# Patient Record
Sex: Female | Born: 1940 | Race: Black or African American | Hispanic: No | Marital: Single
Health system: Southern US, Community
[De-identification: ages and names within clinical notes are randomized; demographics above are authoritative.]

---

## 1999-11-20 ENCOUNTER — Encounter: Admission: RE | Admit: 1999-11-20 | Discharge: 1999-11-20 | Payer: Self-pay | Admitting: *Deleted

## 1999-11-20 ENCOUNTER — Encounter: Payer: Self-pay | Admitting: *Deleted

## 2018-01-23 ENCOUNTER — Other Ambulatory Visit: Payer: Self-pay | Admitting: Pharmacist

## 2018-01-23 NOTE — Patient Outreach (Signed)
[facility] Care Management  01/23/2018  Signe Coltlvina Leggette January 19, 1941 [id number]   Incoming call from [name] in response to the [facility]. Speak with patient. HIPAA identifiers verified and verbal consent received.  Ms. [name] reports that she takes her Janumet 50-1000 mg once daily as directed. Denies any missed doses or barriers to adherence. Reports that she has Extra Help with the cost of her medications. Reports that her blood sugar first thing this morning was 112 mg/dL. Counsel patient on the importance of medication adherence and discuss blood sugar control. Patient verbalizes understanding. Reports that she does not currently use a pillbox to organize her medications. Counsel on the benefit of this tool to aid with adherence. Patient expresses interest in using a weekly pillbox to improve her adherence and reports that she will go to pick one up from her pharmacy.  Ms. [name] denies any further medication questions/concerns at this time. Provide patient with my phone number. Will close pharmacy episode.  Duanne MoronElisabeth Michall Noffke, PharmD, [facility] Clinical Pharmacist [facility] Care Management [phone]

## 2018-12-04 ENCOUNTER — Other Ambulatory Visit: Payer: Self-pay

## 2018-12-04 NOTE — Patient Outreach (Signed)
[facility]) Care Management  12/04/2018  Christina Valentine 09-05-1940 [id number]   Medication Adherence call to Christina Valentine Hippa Identifiers Verify spoke with patient she is due on Janumet Xr 50/1000 mg patient explain her son picks up her medication from the pharmacy and will pick  Up soon, patient wants more information on patients assistance. kevin rph will call patient to better assist. Christina Valentine is showing past due under [facility] Ins.   Lillia Abed CPhT Pharmacy Technician [facility] Care Management Direct Dial [phone]  Fax [phone] [email or web address]

## 2019-01-01 ENCOUNTER — Other Ambulatory Visit: Payer: Self-pay | Admitting: Pharmacist

## 2019-01-01 NOTE — Patient Outreach (Signed)
[facility]) Care Management  01/01/2019  Navreet Forino 1941/06/08 [id number]  Received message from pharmacy technician Ana, to reach out to patient regarding medication cost concerns for Janumet XR.    Successful outreach to patient, HIPAA identifiers verified with patient and she agreed to call.  Today patient denies cost concerns with her Janumet XR.  She reports she is aware she receives Extra Help for her prescription medication cost.   Patient reports she is due to call in a refill of her medication today.   Discussed with patient she may have cost savings if she were to obtain a 90 day supply of her medication vs 30 day and she reports she will discuss at her next doctor appointment.  She reports she has seen both Dr Andreas Blower and Dr Aurora Mask.   Plan:  Will route note to Dr Yetta Flock as an update.   Would recommend consideration to a 90 day supply of patient's maintenance medications to potentially improve medication adherence.   Tommye Standard, PharmD, [facility] Clinical Pharmacist [facility] [phone]

## 2019-02-14 DIAGNOSIS — R600 Localized edema: Secondary | ICD-10-CM | POA: Diagnosis not present

## 2019-02-14 DIAGNOSIS — Z6831 Body mass index (BMI) 31.0-31.9, adult: Secondary | ICD-10-CM | POA: Diagnosis not present

## 2019-02-14 DIAGNOSIS — M79605 Pain in left leg: Secondary | ICD-10-CM | POA: Diagnosis not present

## 2019-02-14 DIAGNOSIS — R2242 Localized swelling, mass and lump, left lower limb: Secondary | ICD-10-CM | POA: Diagnosis not present

## 2019-02-21 DIAGNOSIS — M7122 Synovial cyst of popliteal space [Baker], left knee: Secondary | ICD-10-CM | POA: Diagnosis not present

## 2019-02-21 DIAGNOSIS — R600 Localized edema: Secondary | ICD-10-CM | POA: Diagnosis not present

## 2019-02-21 DIAGNOSIS — Z6830 Body mass index (BMI) 30.0-30.9, adult: Secondary | ICD-10-CM | POA: Diagnosis not present

## 2019-03-30 DIAGNOSIS — E1169 Type 2 diabetes mellitus with other specified complication: Secondary | ICD-10-CM | POA: Diagnosis not present

## 2019-03-30 DIAGNOSIS — E1142 Type 2 diabetes mellitus with diabetic polyneuropathy: Secondary | ICD-10-CM | POA: Diagnosis not present

## 2019-03-30 DIAGNOSIS — E1159 Type 2 diabetes mellitus with other circulatory complications: Secondary | ICD-10-CM | POA: Diagnosis not present

## 2019-04-09 DIAGNOSIS — R600 Localized edema: Secondary | ICD-10-CM | POA: Diagnosis not present

## 2019-04-09 DIAGNOSIS — M79662 Pain in left lower leg: Secondary | ICD-10-CM | POA: Diagnosis not present

## 2019-04-09 DIAGNOSIS — E1159 Type 2 diabetes mellitus with other circulatory complications: Secondary | ICD-10-CM | POA: Diagnosis not present

## 2019-04-09 DIAGNOSIS — E782 Mixed hyperlipidemia: Secondary | ICD-10-CM | POA: Diagnosis not present

## 2019-04-09 DIAGNOSIS — E1169 Type 2 diabetes mellitus with other specified complication: Secondary | ICD-10-CM | POA: Diagnosis not present

## 2019-04-09 DIAGNOSIS — M7989 Other specified soft tissue disorders: Secondary | ICD-10-CM | POA: Diagnosis not present

## 2019-05-15 DIAGNOSIS — I739 Peripheral vascular disease, unspecified: Secondary | ICD-10-CM | POA: Diagnosis not present

## 2019-05-15 DIAGNOSIS — I878 Other specified disorders of veins: Secondary | ICD-10-CM | POA: Diagnosis not present

## 2019-07-26 DIAGNOSIS — E1159 Type 2 diabetes mellitus with other circulatory complications: Secondary | ICD-10-CM | POA: Diagnosis not present

## 2019-07-26 DIAGNOSIS — E1169 Type 2 diabetes mellitus with other specified complication: Secondary | ICD-10-CM | POA: Diagnosis not present

## 2019-08-03 DIAGNOSIS — E785 Hyperlipidemia, unspecified: Secondary | ICD-10-CM | POA: Diagnosis not present

## 2019-08-03 DIAGNOSIS — I10 Essential (primary) hypertension: Secondary | ICD-10-CM | POA: Diagnosis not present

## 2019-08-03 DIAGNOSIS — E1142 Type 2 diabetes mellitus with diabetic polyneuropathy: Secondary | ICD-10-CM | POA: Diagnosis not present

## 2019-08-03 DIAGNOSIS — E1159 Type 2 diabetes mellitus with other circulatory complications: Secondary | ICD-10-CM | POA: Diagnosis not present

## 2019-11-26 DIAGNOSIS — E1169 Type 2 diabetes mellitus with other specified complication: Secondary | ICD-10-CM | POA: Diagnosis not present

## 2019-12-03 DIAGNOSIS — Z0000 Encounter for general adult medical examination without abnormal findings: Secondary | ICD-10-CM | POA: Diagnosis not present

## 2019-12-03 DIAGNOSIS — Z136 Encounter for screening for cardiovascular disorders: Secondary | ICD-10-CM | POA: Diagnosis not present

## 2019-12-03 DIAGNOSIS — Z139 Encounter for screening, unspecified: Secondary | ICD-10-CM | POA: Diagnosis not present

## 2019-12-03 DIAGNOSIS — E1142 Type 2 diabetes mellitus with diabetic polyneuropathy: Secondary | ICD-10-CM | POA: Diagnosis not present

## 2019-12-03 DIAGNOSIS — E785 Hyperlipidemia, unspecified: Secondary | ICD-10-CM | POA: Diagnosis not present

## 2019-12-03 DIAGNOSIS — Z7189 Other specified counseling: Secondary | ICD-10-CM | POA: Diagnosis not present

## 2019-12-03 DIAGNOSIS — E1159 Type 2 diabetes mellitus with other circulatory complications: Secondary | ICD-10-CM | POA: Diagnosis not present

## 2019-12-03 DIAGNOSIS — I152 Hypertension secondary to endocrine disorders: Secondary | ICD-10-CM | POA: Diagnosis not present

## 2019-12-14 DIAGNOSIS — E785 Hyperlipidemia, unspecified: Secondary | ICD-10-CM | POA: Diagnosis not present

## 2019-12-14 DIAGNOSIS — I152 Hypertension secondary to endocrine disorders: Secondary | ICD-10-CM | POA: Diagnosis not present

## 2019-12-14 DIAGNOSIS — E1142 Type 2 diabetes mellitus with diabetic polyneuropathy: Secondary | ICD-10-CM | POA: Diagnosis not present

## 2020-01-14 DIAGNOSIS — E1159 Type 2 diabetes mellitus with other circulatory complications: Secondary | ICD-10-CM | POA: Diagnosis not present

## 2020-01-14 DIAGNOSIS — E785 Hyperlipidemia, unspecified: Secondary | ICD-10-CM | POA: Diagnosis not present

## 2020-01-14 DIAGNOSIS — I152 Hypertension secondary to endocrine disorders: Secondary | ICD-10-CM | POA: Diagnosis not present

## 2020-01-14 DIAGNOSIS — E1142 Type 2 diabetes mellitus with diabetic polyneuropathy: Secondary | ICD-10-CM | POA: Diagnosis not present

## 2020-02-13 DIAGNOSIS — E1159 Type 2 diabetes mellitus with other circulatory complications: Secondary | ICD-10-CM | POA: Diagnosis not present

## 2020-02-13 DIAGNOSIS — E785 Hyperlipidemia, unspecified: Secondary | ICD-10-CM | POA: Diagnosis not present

## 2020-02-13 DIAGNOSIS — I152 Hypertension secondary to endocrine disorders: Secondary | ICD-10-CM | POA: Diagnosis not present

## 2020-02-13 DIAGNOSIS — E1142 Type 2 diabetes mellitus with diabetic polyneuropathy: Secondary | ICD-10-CM | POA: Diagnosis not present

## 2020-03-07 DIAGNOSIS — E1169 Type 2 diabetes mellitus with other specified complication: Secondary | ICD-10-CM | POA: Diagnosis not present

## 2020-03-14 DIAGNOSIS — N1831 Chronic kidney disease, stage 3a: Secondary | ICD-10-CM | POA: Diagnosis not present

## 2020-03-14 DIAGNOSIS — E1142 Type 2 diabetes mellitus with diabetic polyneuropathy: Secondary | ICD-10-CM | POA: Diagnosis not present

## 2020-03-14 DIAGNOSIS — E785 Hyperlipidemia, unspecified: Secondary | ICD-10-CM | POA: Diagnosis not present

## 2020-03-14 DIAGNOSIS — E1159 Type 2 diabetes mellitus with other circulatory complications: Secondary | ICD-10-CM | POA: Diagnosis not present

## 2020-03-16 DIAGNOSIS — N1831 Chronic kidney disease, stage 3a: Secondary | ICD-10-CM | POA: Diagnosis not present

## 2020-03-16 DIAGNOSIS — E1142 Type 2 diabetes mellitus with diabetic polyneuropathy: Secondary | ICD-10-CM | POA: Diagnosis not present

## 2020-03-16 DIAGNOSIS — E785 Hyperlipidemia, unspecified: Secondary | ICD-10-CM | POA: Diagnosis not present

## 2020-03-16 DIAGNOSIS — E1159 Type 2 diabetes mellitus with other circulatory complications: Secondary | ICD-10-CM | POA: Diagnosis not present

## 2020-04-14 DIAGNOSIS — I152 Hypertension secondary to endocrine disorders: Secondary | ICD-10-CM | POA: Diagnosis not present

## 2020-04-14 DIAGNOSIS — J309 Allergic rhinitis, unspecified: Secondary | ICD-10-CM | POA: Diagnosis not present

## 2020-04-14 DIAGNOSIS — N1831 Chronic kidney disease, stage 3a: Secondary | ICD-10-CM | POA: Diagnosis not present

## 2020-04-14 DIAGNOSIS — E1159 Type 2 diabetes mellitus with other circulatory complications: Secondary | ICD-10-CM | POA: Diagnosis not present

## 2020-04-15 DIAGNOSIS — N1831 Chronic kidney disease, stage 3a: Secondary | ICD-10-CM | POA: Diagnosis not present

## 2020-04-15 DIAGNOSIS — E1159 Type 2 diabetes mellitus with other circulatory complications: Secondary | ICD-10-CM | POA: Diagnosis not present

## 2020-04-15 DIAGNOSIS — I152 Hypertension secondary to endocrine disorders: Secondary | ICD-10-CM | POA: Diagnosis not present

## 2020-04-16 DIAGNOSIS — I152 Hypertension secondary to endocrine disorders: Secondary | ICD-10-CM | POA: Diagnosis not present

## 2020-04-16 DIAGNOSIS — E1159 Type 2 diabetes mellitus with other circulatory complications: Secondary | ICD-10-CM | POA: Diagnosis not present

## 2020-04-16 DIAGNOSIS — N1831 Chronic kidney disease, stage 3a: Secondary | ICD-10-CM | POA: Diagnosis not present

## 2020-04-22 DIAGNOSIS — I152 Hypertension secondary to endocrine disorders: Secondary | ICD-10-CM | POA: Diagnosis not present

## 2020-04-22 DIAGNOSIS — E1159 Type 2 diabetes mellitus with other circulatory complications: Secondary | ICD-10-CM | POA: Diagnosis not present

## 2020-05-16 DIAGNOSIS — I152 Hypertension secondary to endocrine disorders: Secondary | ICD-10-CM | POA: Diagnosis not present

## 2020-05-16 DIAGNOSIS — E1159 Type 2 diabetes mellitus with other circulatory complications: Secondary | ICD-10-CM | POA: Diagnosis not present

## 2020-05-16 DIAGNOSIS — N1831 Chronic kidney disease, stage 3a: Secondary | ICD-10-CM | POA: Diagnosis not present

## 2020-06-15 DIAGNOSIS — I152 Hypertension secondary to endocrine disorders: Secondary | ICD-10-CM | POA: Diagnosis not present

## 2020-06-16 DIAGNOSIS — N1831 Chronic kidney disease, stage 3a: Secondary | ICD-10-CM | POA: Diagnosis not present

## 2020-06-16 DIAGNOSIS — I152 Hypertension secondary to endocrine disorders: Secondary | ICD-10-CM | POA: Diagnosis not present

## 2020-06-16 DIAGNOSIS — E1159 Type 2 diabetes mellitus with other circulatory complications: Secondary | ICD-10-CM | POA: Diagnosis not present

## 2020-07-14 DIAGNOSIS — E1169 Type 2 diabetes mellitus with other specified complication: Secondary | ICD-10-CM | POA: Diagnosis not present

## 2020-07-15 DIAGNOSIS — I152 Hypertension secondary to endocrine disorders: Secondary | ICD-10-CM | POA: Diagnosis not present

## 2020-07-16 DIAGNOSIS — E1159 Type 2 diabetes mellitus with other circulatory complications: Secondary | ICD-10-CM | POA: Diagnosis not present

## 2020-07-16 DIAGNOSIS — I152 Hypertension secondary to endocrine disorders: Secondary | ICD-10-CM | POA: Diagnosis not present

## 2020-07-16 DIAGNOSIS — N1831 Chronic kidney disease, stage 3a: Secondary | ICD-10-CM | POA: Diagnosis not present

## 2020-07-21 DIAGNOSIS — E1169 Type 2 diabetes mellitus with other specified complication: Secondary | ICD-10-CM | POA: Diagnosis not present

## 2020-07-21 DIAGNOSIS — Z23 Encounter for immunization: Secondary | ICD-10-CM | POA: Diagnosis not present

## 2020-07-21 DIAGNOSIS — E1159 Type 2 diabetes mellitus with other circulatory complications: Secondary | ICD-10-CM | POA: Diagnosis not present

## 2020-07-21 DIAGNOSIS — I152 Hypertension secondary to endocrine disorders: Secondary | ICD-10-CM | POA: Diagnosis not present

## 2020-07-21 DIAGNOSIS — E782 Mixed hyperlipidemia: Secondary | ICD-10-CM | POA: Diagnosis not present

## 2020-08-04 DIAGNOSIS — H7291 Unspecified perforation of tympanic membrane, right ear: Secondary | ICD-10-CM | POA: Diagnosis not present

## 2020-08-04 DIAGNOSIS — H60399 Other infective otitis externa, unspecified ear: Secondary | ICD-10-CM | POA: Diagnosis not present

## 2020-08-04 DIAGNOSIS — H6611 Chronic tubotympanic suppurative otitis media, right ear: Secondary | ICD-10-CM | POA: Diagnosis not present

## 2020-08-15 DIAGNOSIS — I152 Hypertension secondary to endocrine disorders: Secondary | ICD-10-CM | POA: Diagnosis not present

## 2020-08-16 DIAGNOSIS — E1159 Type 2 diabetes mellitus with other circulatory complications: Secondary | ICD-10-CM | POA: Diagnosis not present

## 2020-08-16 DIAGNOSIS — I152 Hypertension secondary to endocrine disorders: Secondary | ICD-10-CM | POA: Diagnosis not present

## 2020-08-16 DIAGNOSIS — E782 Mixed hyperlipidemia: Secondary | ICD-10-CM | POA: Diagnosis not present

## 2020-09-15 DIAGNOSIS — I152 Hypertension secondary to endocrine disorders: Secondary | ICD-10-CM | POA: Diagnosis not present

## 2020-09-16 DIAGNOSIS — H6611 Chronic tubotympanic suppurative otitis media, right ear: Secondary | ICD-10-CM | POA: Diagnosis not present

## 2020-09-16 DIAGNOSIS — I152 Hypertension secondary to endocrine disorders: Secondary | ICD-10-CM | POA: Diagnosis not present

## 2020-09-16 DIAGNOSIS — E782 Mixed hyperlipidemia: Secondary | ICD-10-CM | POA: Diagnosis not present

## 2020-09-16 DIAGNOSIS — E1159 Type 2 diabetes mellitus with other circulatory complications: Secondary | ICD-10-CM | POA: Diagnosis not present

## 2020-10-13 DIAGNOSIS — I152 Hypertension secondary to endocrine disorders: Secondary | ICD-10-CM | POA: Diagnosis not present

## 2020-10-14 DIAGNOSIS — E1159 Type 2 diabetes mellitus with other circulatory complications: Secondary | ICD-10-CM | POA: Diagnosis not present

## 2020-10-14 DIAGNOSIS — I152 Hypertension secondary to endocrine disorders: Secondary | ICD-10-CM | POA: Diagnosis not present

## 2020-10-14 DIAGNOSIS — E782 Mixed hyperlipidemia: Secondary | ICD-10-CM | POA: Diagnosis not present

## 2020-11-13 DIAGNOSIS — I152 Hypertension secondary to endocrine disorders: Secondary | ICD-10-CM | POA: Diagnosis not present

## 2020-11-14 DIAGNOSIS — E1159 Type 2 diabetes mellitus with other circulatory complications: Secondary | ICD-10-CM | POA: Diagnosis not present

## 2020-11-14 DIAGNOSIS — E1142 Type 2 diabetes mellitus with diabetic polyneuropathy: Secondary | ICD-10-CM | POA: Diagnosis not present

## 2020-11-14 DIAGNOSIS — E1169 Type 2 diabetes mellitus with other specified complication: Secondary | ICD-10-CM | POA: Diagnosis not present

## 2020-11-17 DIAGNOSIS — E1169 Type 2 diabetes mellitus with other specified complication: Secondary | ICD-10-CM | POA: Diagnosis not present

## 2020-11-24 DIAGNOSIS — Z7189 Other specified counseling: Secondary | ICD-10-CM | POA: Diagnosis not present

## 2020-11-24 DIAGNOSIS — Z139 Encounter for screening, unspecified: Secondary | ICD-10-CM | POA: Diagnosis not present

## 2020-11-24 DIAGNOSIS — Z0000 Encounter for general adult medical examination without abnormal findings: Secondary | ICD-10-CM | POA: Diagnosis not present

## 2020-11-24 DIAGNOSIS — E782 Mixed hyperlipidemia: Secondary | ICD-10-CM | POA: Diagnosis not present

## 2020-11-24 DIAGNOSIS — I152 Hypertension secondary to endocrine disorders: Secondary | ICD-10-CM | POA: Diagnosis not present

## 2020-11-24 DIAGNOSIS — E1159 Type 2 diabetes mellitus with other circulatory complications: Secondary | ICD-10-CM | POA: Diagnosis not present

## 2020-11-24 DIAGNOSIS — Z23 Encounter for immunization: Secondary | ICD-10-CM | POA: Diagnosis not present

## 2020-11-24 DIAGNOSIS — E1169 Type 2 diabetes mellitus with other specified complication: Secondary | ICD-10-CM | POA: Diagnosis not present

## 2020-11-24 DIAGNOSIS — Z136 Encounter for screening for cardiovascular disorders: Secondary | ICD-10-CM | POA: Diagnosis not present

## 2020-12-04 DIAGNOSIS — R059 Cough, unspecified: Secondary | ICD-10-CM | POA: Diagnosis not present

## 2020-12-04 DIAGNOSIS — Z1152 Encounter for screening for COVID-19: Secondary | ICD-10-CM | POA: Diagnosis not present

## 2020-12-04 DIAGNOSIS — J302 Other seasonal allergic rhinitis: Secondary | ICD-10-CM | POA: Diagnosis not present

## 2020-12-04 DIAGNOSIS — Z20822 Contact with and (suspected) exposure to covid-19: Secondary | ICD-10-CM | POA: Diagnosis not present

## 2020-12-13 DIAGNOSIS — I152 Hypertension secondary to endocrine disorders: Secondary | ICD-10-CM | POA: Diagnosis not present

## 2020-12-14 DIAGNOSIS — E1159 Type 2 diabetes mellitus with other circulatory complications: Secondary | ICD-10-CM | POA: Diagnosis not present

## 2020-12-14 DIAGNOSIS — E1169 Type 2 diabetes mellitus with other specified complication: Secondary | ICD-10-CM | POA: Diagnosis not present

## 2020-12-14 DIAGNOSIS — E1142 Type 2 diabetes mellitus with diabetic polyneuropathy: Secondary | ICD-10-CM | POA: Diagnosis not present

## 2021-01-13 DIAGNOSIS — I152 Hypertension secondary to endocrine disorders: Secondary | ICD-10-CM | POA: Diagnosis not present

## 2021-01-14 DIAGNOSIS — I152 Hypertension secondary to endocrine disorders: Secondary | ICD-10-CM | POA: Diagnosis not present

## 2021-01-14 DIAGNOSIS — E1142 Type 2 diabetes mellitus with diabetic polyneuropathy: Secondary | ICD-10-CM | POA: Diagnosis not present

## 2021-01-14 DIAGNOSIS — E1159 Type 2 diabetes mellitus with other circulatory complications: Secondary | ICD-10-CM | POA: Diagnosis not present

## 2021-01-27 ENCOUNTER — Other Ambulatory Visit: Payer: Self-pay | Admitting: Family Medicine

## 2021-01-27 DIAGNOSIS — Z139 Encounter for screening, unspecified: Secondary | ICD-10-CM

## 2021-02-04 ENCOUNTER — Other Ambulatory Visit: Payer: Self-pay

## 2021-02-04 ENCOUNTER — Ambulatory Visit
Admission: RE | Admit: 2021-02-04 | Discharge: 2021-02-04 | Disposition: A | Discharge status: Home / Self Care | Payer: Medicare Other | Source: Ambulatory Visit | Attending: Family Medicine | Admitting: Family Medicine

## 2021-02-04 DIAGNOSIS — Z139 Encounter for screening, unspecified: Secondary | ICD-10-CM

## 2021-02-04 DIAGNOSIS — Z1231 Encounter for screening mammogram for malignant neoplasm of breast: Secondary | ICD-10-CM | POA: Diagnosis not present

## 2021-02-12 DIAGNOSIS — I152 Hypertension secondary to endocrine disorders: Secondary | ICD-10-CM | POA: Diagnosis not present

## 2021-02-13 DIAGNOSIS — I152 Hypertension secondary to endocrine disorders: Secondary | ICD-10-CM | POA: Diagnosis not present

## 2021-02-13 DIAGNOSIS — E1142 Type 2 diabetes mellitus with diabetic polyneuropathy: Secondary | ICD-10-CM | POA: Diagnosis not present

## 2021-03-15 DIAGNOSIS — I152 Hypertension secondary to endocrine disorders: Secondary | ICD-10-CM | POA: Diagnosis not present

## 2021-03-16 DIAGNOSIS — I152 Hypertension secondary to endocrine disorders: Secondary | ICD-10-CM | POA: Diagnosis not present

## 2021-03-16 DIAGNOSIS — E1142 Type 2 diabetes mellitus with diabetic polyneuropathy: Secondary | ICD-10-CM | POA: Diagnosis not present

## 2021-03-30 DIAGNOSIS — E1159 Type 2 diabetes mellitus with other circulatory complications: Secondary | ICD-10-CM | POA: Diagnosis not present

## 2021-03-30 DIAGNOSIS — E1169 Type 2 diabetes mellitus with other specified complication: Secondary | ICD-10-CM | POA: Diagnosis not present

## 2021-04-06 DIAGNOSIS — I152 Hypertension secondary to endocrine disorders: Secondary | ICD-10-CM | POA: Diagnosis not present

## 2021-04-06 DIAGNOSIS — E782 Mixed hyperlipidemia: Secondary | ICD-10-CM | POA: Diagnosis not present

## 2021-04-06 DIAGNOSIS — E1169 Type 2 diabetes mellitus with other specified complication: Secondary | ICD-10-CM | POA: Diagnosis not present

## 2021-04-06 DIAGNOSIS — E1159 Type 2 diabetes mellitus with other circulatory complications: Secondary | ICD-10-CM | POA: Diagnosis not present

## 2021-04-15 DIAGNOSIS — I152 Hypertension secondary to endocrine disorders: Secondary | ICD-10-CM | POA: Diagnosis not present

## 2021-04-16 DIAGNOSIS — E1142 Type 2 diabetes mellitus with diabetic polyneuropathy: Secondary | ICD-10-CM | POA: Diagnosis not present

## 2021-04-16 DIAGNOSIS — I152 Hypertension secondary to endocrine disorders: Secondary | ICD-10-CM | POA: Diagnosis not present

## 2021-05-05 DIAGNOSIS — E1169 Type 2 diabetes mellitus with other specified complication: Secondary | ICD-10-CM | POA: Diagnosis not present

## 2021-05-05 DIAGNOSIS — E1159 Type 2 diabetes mellitus with other circulatory complications: Secondary | ICD-10-CM | POA: Diagnosis not present

## 2021-05-05 DIAGNOSIS — E785 Hyperlipidemia, unspecified: Secondary | ICD-10-CM | POA: Diagnosis not present

## 2021-05-15 DIAGNOSIS — I152 Hypertension secondary to endocrine disorders: Secondary | ICD-10-CM | POA: Diagnosis not present

## 2021-05-16 DIAGNOSIS — I152 Hypertension secondary to endocrine disorders: Secondary | ICD-10-CM | POA: Diagnosis not present

## 2021-05-16 DIAGNOSIS — E1142 Type 2 diabetes mellitus with diabetic polyneuropathy: Secondary | ICD-10-CM | POA: Diagnosis not present

## 2021-05-18 DIAGNOSIS — E1142 Type 2 diabetes mellitus with diabetic polyneuropathy: Secondary | ICD-10-CM | POA: Diagnosis not present

## 2021-05-18 DIAGNOSIS — Z23 Encounter for immunization: Secondary | ICD-10-CM | POA: Diagnosis not present

## 2021-05-18 DIAGNOSIS — E785 Hyperlipidemia, unspecified: Secondary | ICD-10-CM | POA: Diagnosis not present

## 2021-05-18 DIAGNOSIS — E1159 Type 2 diabetes mellitus with other circulatory complications: Secondary | ICD-10-CM | POA: Diagnosis not present

## 2021-05-18 DIAGNOSIS — N1831 Chronic kidney disease, stage 3a: Secondary | ICD-10-CM | POA: Diagnosis not present

## 2021-05-27 DIAGNOSIS — Z23 Encounter for immunization: Secondary | ICD-10-CM | POA: Diagnosis not present

## 2021-05-27 DIAGNOSIS — H6122 Impacted cerumen, left ear: Secondary | ICD-10-CM | POA: Diagnosis not present

## 2021-06-03 DIAGNOSIS — H6122 Impacted cerumen, left ear: Secondary | ICD-10-CM | POA: Diagnosis not present

## 2021-06-05 DIAGNOSIS — E1159 Type 2 diabetes mellitus with other circulatory complications: Secondary | ICD-10-CM | POA: Diagnosis not present

## 2021-06-05 DIAGNOSIS — I152 Hypertension secondary to endocrine disorders: Secondary | ICD-10-CM | POA: Diagnosis not present

## 2021-06-15 DIAGNOSIS — I152 Hypertension secondary to endocrine disorders: Secondary | ICD-10-CM | POA: Diagnosis not present

## 2021-06-16 DIAGNOSIS — I152 Hypertension secondary to endocrine disorders: Secondary | ICD-10-CM | POA: Diagnosis not present

## 2021-06-16 DIAGNOSIS — E1142 Type 2 diabetes mellitus with diabetic polyneuropathy: Secondary | ICD-10-CM | POA: Diagnosis not present

## 2021-07-16 DIAGNOSIS — E1159 Type 2 diabetes mellitus with other circulatory complications: Secondary | ICD-10-CM | POA: Diagnosis not present

## 2021-07-16 DIAGNOSIS — E1142 Type 2 diabetes mellitus with diabetic polyneuropathy: Secondary | ICD-10-CM | POA: Diagnosis not present

## 2021-08-14 DIAGNOSIS — I152 Hypertension secondary to endocrine disorders: Secondary | ICD-10-CM | POA: Diagnosis not present

## 2021-08-16 DIAGNOSIS — E785 Hyperlipidemia, unspecified: Secondary | ICD-10-CM | POA: Diagnosis not present

## 2021-08-16 DIAGNOSIS — E1142 Type 2 diabetes mellitus with diabetic polyneuropathy: Secondary | ICD-10-CM | POA: Diagnosis not present

## 2021-08-16 DIAGNOSIS — N1831 Chronic kidney disease, stage 3a: Secondary | ICD-10-CM | POA: Diagnosis not present

## 2021-08-16 DIAGNOSIS — E1159 Type 2 diabetes mellitus with other circulatory complications: Secondary | ICD-10-CM | POA: Diagnosis not present

## 2021-09-04 DIAGNOSIS — E1169 Type 2 diabetes mellitus with other specified complication: Secondary | ICD-10-CM | POA: Diagnosis not present

## 2021-09-11 DIAGNOSIS — I152 Hypertension secondary to endocrine disorders: Secondary | ICD-10-CM | POA: Diagnosis not present

## 2021-09-11 DIAGNOSIS — N1831 Chronic kidney disease, stage 3a: Secondary | ICD-10-CM | POA: Diagnosis not present

## 2021-09-11 DIAGNOSIS — E1142 Type 2 diabetes mellitus with diabetic polyneuropathy: Secondary | ICD-10-CM | POA: Diagnosis not present

## 2021-09-11 DIAGNOSIS — E1159 Type 2 diabetes mellitus with other circulatory complications: Secondary | ICD-10-CM | POA: Diagnosis not present

## 2021-09-13 DIAGNOSIS — I152 Hypertension secondary to endocrine disorders: Secondary | ICD-10-CM | POA: Diagnosis not present

## 2021-09-16 DIAGNOSIS — I152 Hypertension secondary to endocrine disorders: Secondary | ICD-10-CM | POA: Diagnosis not present

## 2021-09-16 DIAGNOSIS — N1831 Chronic kidney disease, stage 3a: Secondary | ICD-10-CM | POA: Diagnosis not present

## 2021-09-16 DIAGNOSIS — E1142 Type 2 diabetes mellitus with diabetic polyneuropathy: Secondary | ICD-10-CM | POA: Diagnosis not present

## 2021-09-16 DIAGNOSIS — E785 Hyperlipidemia, unspecified: Secondary | ICD-10-CM | POA: Diagnosis not present

## 2021-10-14 DIAGNOSIS — E785 Hyperlipidemia, unspecified: Secondary | ICD-10-CM | POA: Diagnosis not present

## 2021-10-14 DIAGNOSIS — N1831 Chronic kidney disease, stage 3a: Secondary | ICD-10-CM | POA: Diagnosis not present

## 2021-10-14 DIAGNOSIS — E1142 Type 2 diabetes mellitus with diabetic polyneuropathy: Secondary | ICD-10-CM | POA: Diagnosis not present

## 2021-10-15 DIAGNOSIS — I152 Hypertension secondary to endocrine disorders: Secondary | ICD-10-CM | POA: Diagnosis not present

## 2021-10-15 DIAGNOSIS — E1159 Type 2 diabetes mellitus with other circulatory complications: Secondary | ICD-10-CM | POA: Diagnosis not present

## 2021-11-14 DIAGNOSIS — E1142 Type 2 diabetes mellitus with diabetic polyneuropathy: Secondary | ICD-10-CM | POA: Diagnosis not present

## 2021-11-14 DIAGNOSIS — E785 Hyperlipidemia, unspecified: Secondary | ICD-10-CM | POA: Diagnosis not present

## 2021-12-13 DIAGNOSIS — E785 Hyperlipidemia, unspecified: Secondary | ICD-10-CM | POA: Diagnosis not present

## 2021-12-13 DIAGNOSIS — E1142 Type 2 diabetes mellitus with diabetic polyneuropathy: Secondary | ICD-10-CM | POA: Diagnosis not present

## 2021-12-14 DIAGNOSIS — E785 Hyperlipidemia, unspecified: Secondary | ICD-10-CM | POA: Diagnosis not present

## 2021-12-14 DIAGNOSIS — E1142 Type 2 diabetes mellitus with diabetic polyneuropathy: Secondary | ICD-10-CM | POA: Diagnosis not present

## 2021-12-24 DIAGNOSIS — E1169 Type 2 diabetes mellitus with other specified complication: Secondary | ICD-10-CM | POA: Diagnosis not present

## 2022-01-04 ENCOUNTER — Other Ambulatory Visit: Payer: Self-pay | Admitting: Family Medicine

## 2022-01-04 DIAGNOSIS — Z1231 Encounter for screening mammogram for malignant neoplasm of breast: Secondary | ICD-10-CM

## 2022-01-04 DIAGNOSIS — E1159 Type 2 diabetes mellitus with other circulatory complications: Secondary | ICD-10-CM | POA: Diagnosis not present

## 2022-01-04 DIAGNOSIS — Z136 Encounter for screening for cardiovascular disorders: Secondary | ICD-10-CM | POA: Diagnosis not present

## 2022-01-04 DIAGNOSIS — Z0000 Encounter for general adult medical examination without abnormal findings: Secondary | ICD-10-CM | POA: Diagnosis not present

## 2022-01-04 DIAGNOSIS — E1142 Type 2 diabetes mellitus with diabetic polyneuropathy: Secondary | ICD-10-CM | POA: Diagnosis not present

## 2022-01-04 DIAGNOSIS — Z7189 Other specified counseling: Secondary | ICD-10-CM | POA: Diagnosis not present

## 2022-01-04 DIAGNOSIS — I152 Hypertension secondary to endocrine disorders: Secondary | ICD-10-CM | POA: Diagnosis not present

## 2022-01-04 DIAGNOSIS — N1831 Chronic kidney disease, stage 3a: Secondary | ICD-10-CM | POA: Diagnosis not present

## 2022-01-04 DIAGNOSIS — Z139 Encounter for screening, unspecified: Secondary | ICD-10-CM | POA: Diagnosis not present

## 2022-01-13 DIAGNOSIS — I152 Hypertension secondary to endocrine disorders: Secondary | ICD-10-CM | POA: Diagnosis not present

## 2022-01-14 DIAGNOSIS — E785 Hyperlipidemia, unspecified: Secondary | ICD-10-CM | POA: Diagnosis not present

## 2022-01-14 DIAGNOSIS — E1142 Type 2 diabetes mellitus with diabetic polyneuropathy: Secondary | ICD-10-CM | POA: Diagnosis not present

## 2022-01-26 IMAGING — MG MM DIGITAL SCREENING BILAT W/ TOMO AND CAD
8 series · 9 of 24 positions shown · non-contrast
Comparison: Previous exam(s).

CLINICAL DATA: Screening.

EXAM:
DIGITAL SCREENING BILATERAL MAMMOGRAM WITH TOMOSYNTHESIS AND CAD
TECHNIQUE: Bilateral screening digital craniocaudal and mediolateral oblique
mammograms were obtained. Bilateral screening digital breast
tomosynthesis was performed. The images were evaluated with
computer-aided detection.

[R MLO synth-2D]
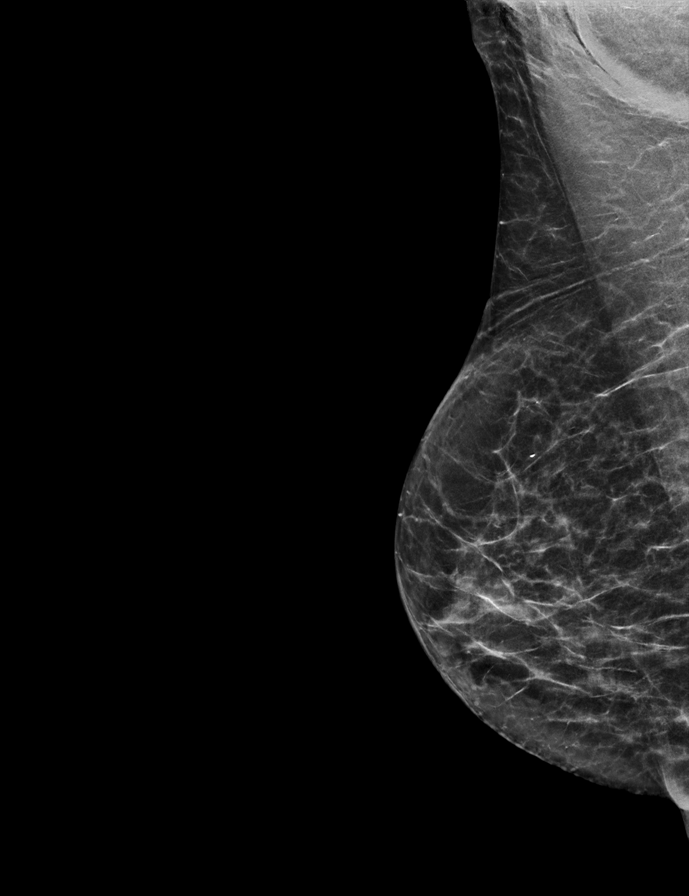

[L MLO synth-2D]
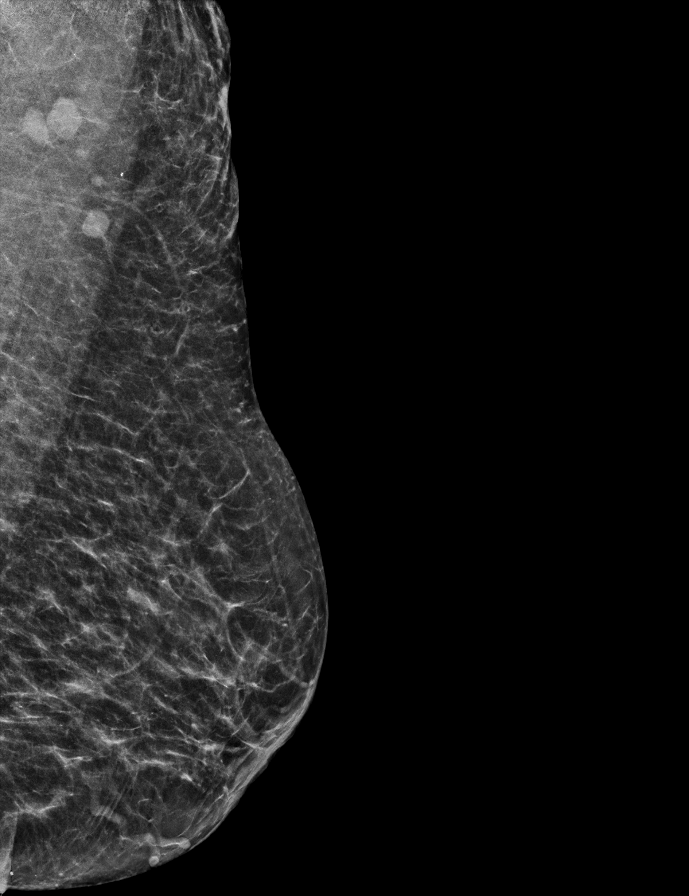

[L CC synth-2D]
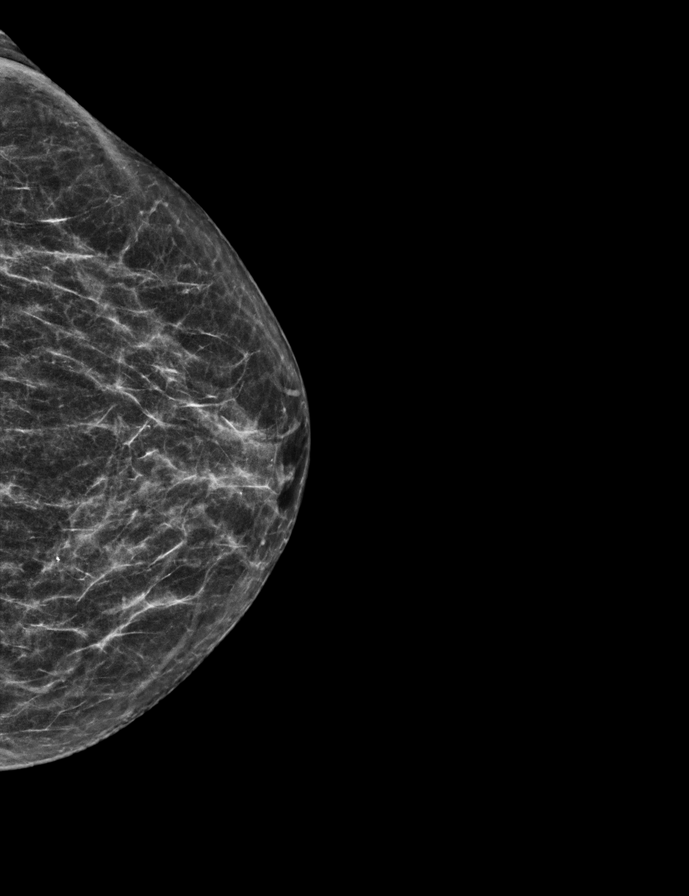

[R CC synth-2D]
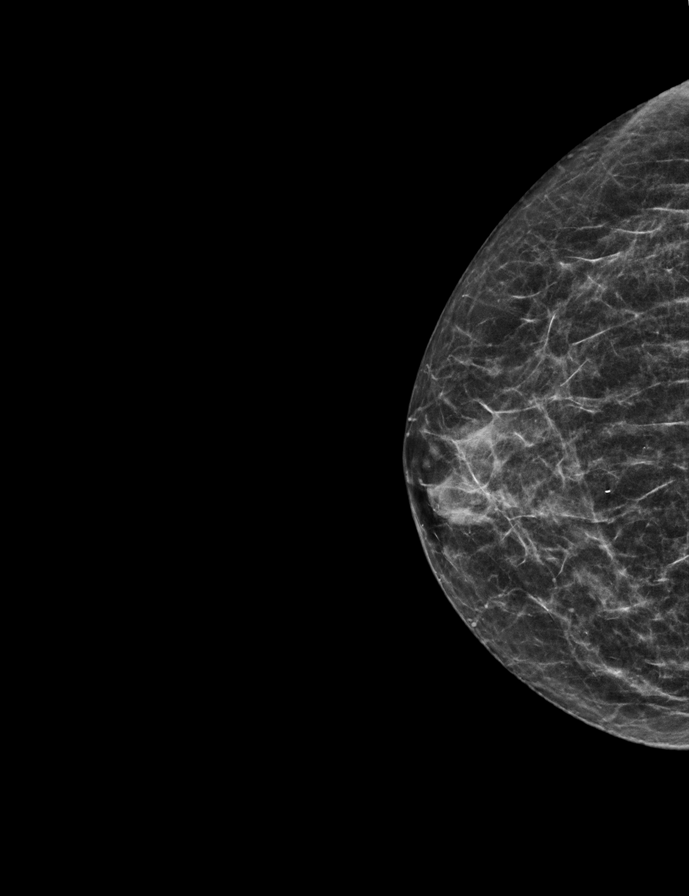

[L MLO tomo · 2 of 48 frames shown]
[frame 16/48]
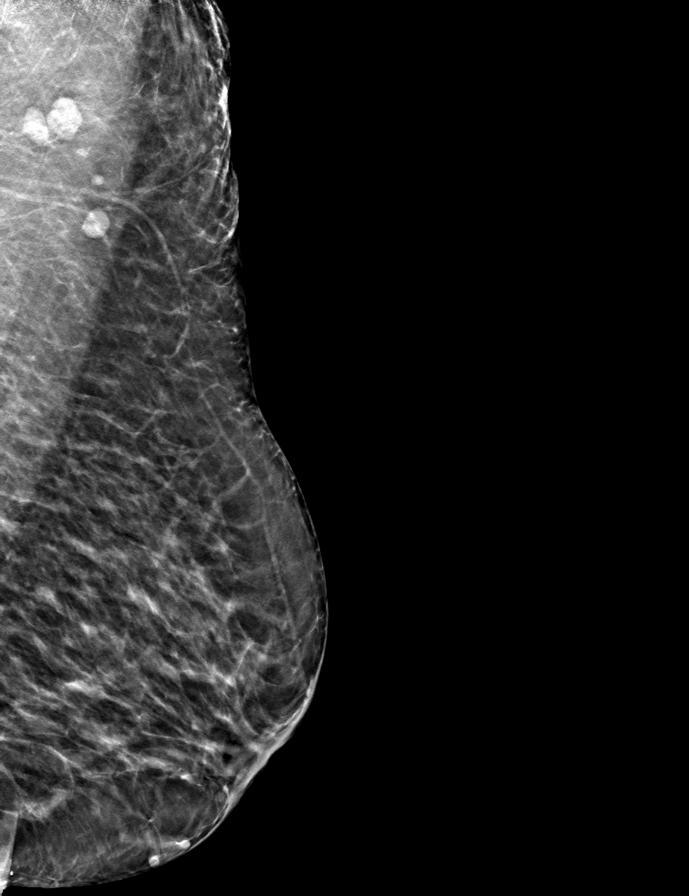
[frame 25/48]
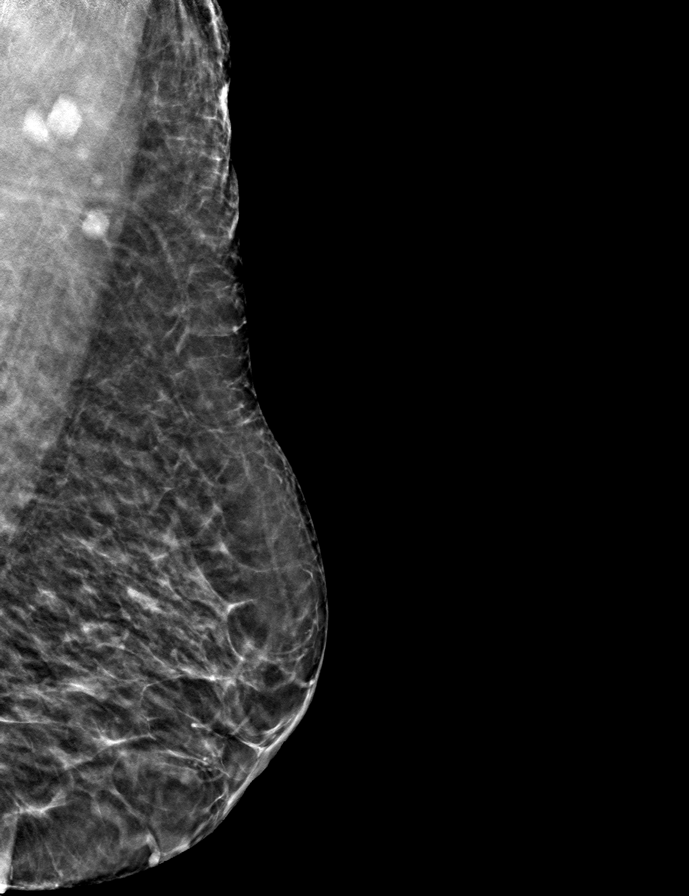

[R CC tomo · tomo slice 21/42.0]
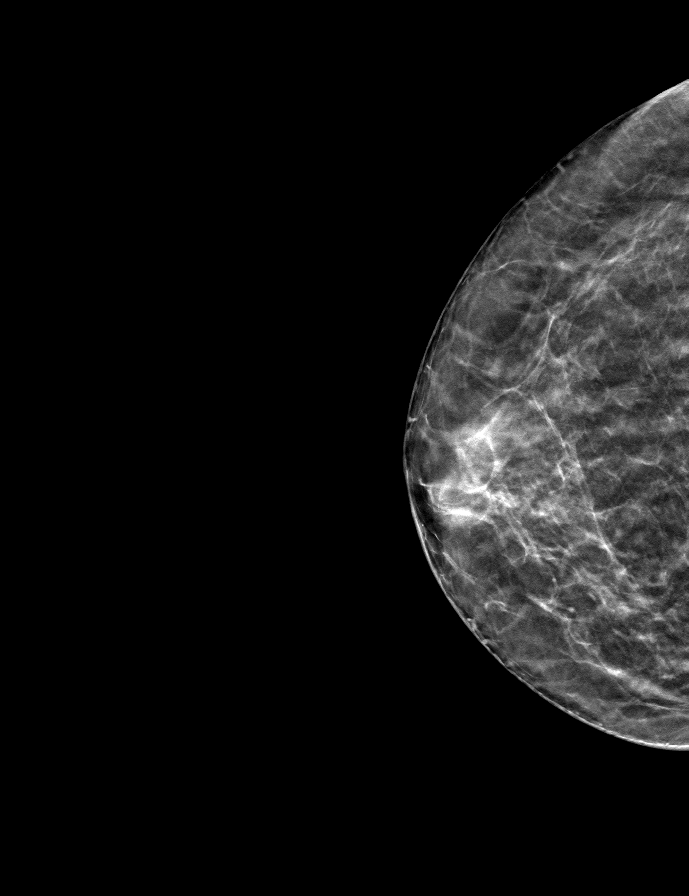

[R MLO tomo · tomo slice 26/51.0]
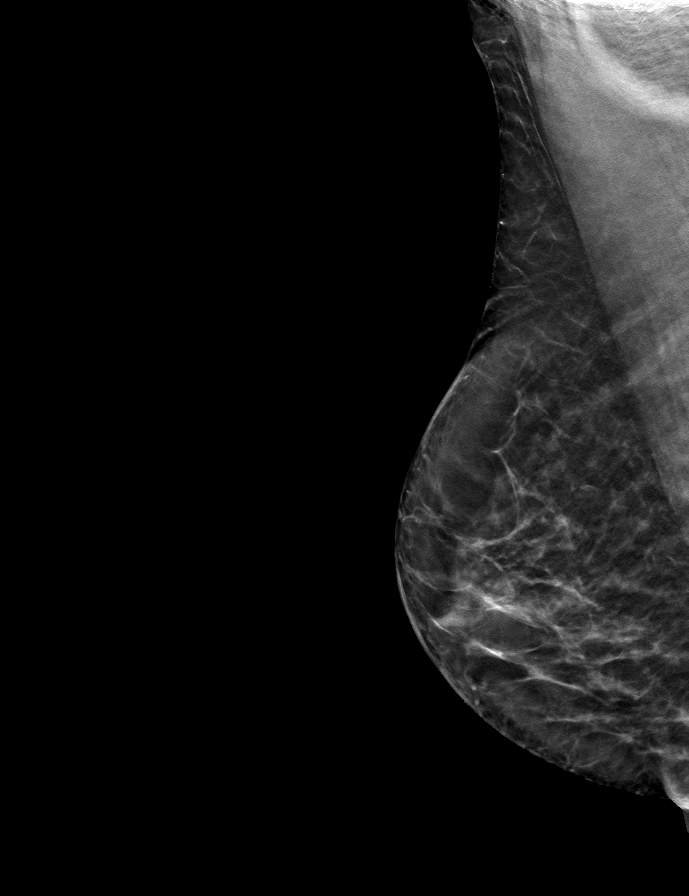

[L CC tomo · tomo slice 25/49.0]
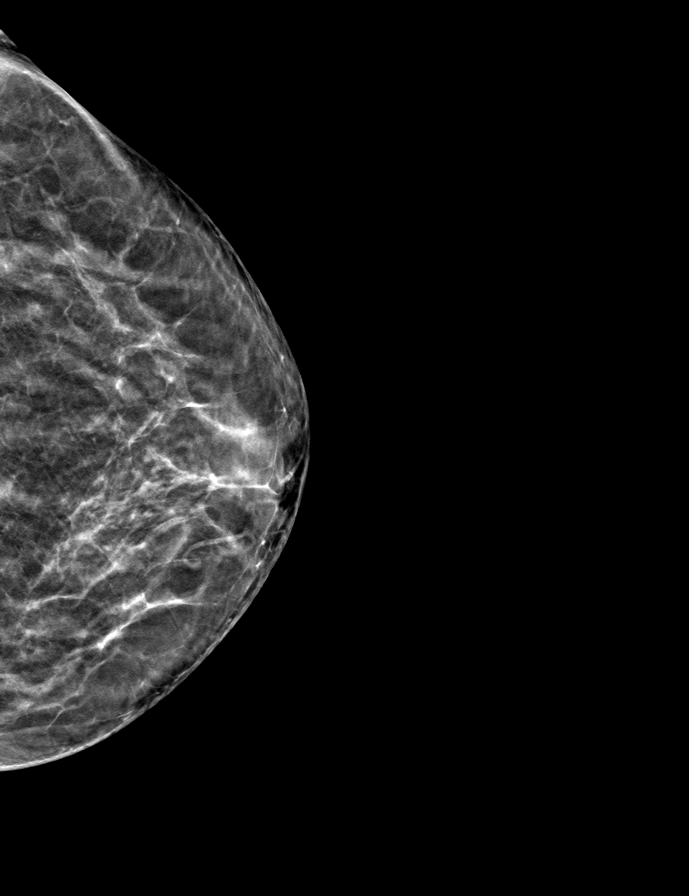

[9 of 24 positions shown; findings below may reference images not displayed]

ACR Breast Density Category b: There are scattered areas of
fibroglandular density.
FINDINGS: There are no findings suspicious for malignancy.
IMPRESSION: No mammographic evidence of malignancy. A result letter of this
screening mammogram will be mailed directly to the patient.

RECOMMENDATION:
Screening mammogram in one year. (Code:51-O-LD2)

BI-RADS CATEGORY  1: Negative.

## 2022-01-27 DIAGNOSIS — H6122 Impacted cerumen, left ear: Secondary | ICD-10-CM | POA: Diagnosis not present

## 2022-02-01 DIAGNOSIS — H6122 Impacted cerumen, left ear: Secondary | ICD-10-CM | POA: Diagnosis not present

## 2022-02-05 DIAGNOSIS — H6122 Impacted cerumen, left ear: Secondary | ICD-10-CM | POA: Diagnosis not present

## 2022-02-12 DIAGNOSIS — E1142 Type 2 diabetes mellitus with diabetic polyneuropathy: Secondary | ICD-10-CM | POA: Diagnosis not present

## 2022-02-13 DIAGNOSIS — E785 Hyperlipidemia, unspecified: Secondary | ICD-10-CM | POA: Diagnosis not present

## 2022-02-13 DIAGNOSIS — E1142 Type 2 diabetes mellitus with diabetic polyneuropathy: Secondary | ICD-10-CM | POA: Diagnosis not present

## 2022-02-23 ENCOUNTER — Ambulatory Visit: Payer: Medicare Other

## 2022-03-01 ENCOUNTER — Ambulatory Visit
Admission: RE | Admit: 2022-03-01 | Discharge: 2022-03-01 | Disposition: A | Discharge status: Home / Self Care | Payer: Medicare Other | Source: Ambulatory Visit | Attending: Family Medicine | Admitting: Family Medicine

## 2022-03-01 DIAGNOSIS — Z1231 Encounter for screening mammogram for malignant neoplasm of breast: Secondary | ICD-10-CM

## 2022-03-15 DIAGNOSIS — H6122 Impacted cerumen, left ear: Secondary | ICD-10-CM | POA: Diagnosis not present

## 2022-03-15 DIAGNOSIS — N1831 Chronic kidney disease, stage 3a: Secondary | ICD-10-CM | POA: Diagnosis not present

## 2022-03-15 DIAGNOSIS — Z1231 Encounter for screening mammogram for malignant neoplasm of breast: Secondary | ICD-10-CM | POA: Diagnosis not present

## 2022-03-15 DIAGNOSIS — I152 Hypertension secondary to endocrine disorders: Secondary | ICD-10-CM | POA: Diagnosis not present

## 2022-03-15 DIAGNOSIS — E1142 Type 2 diabetes mellitus with diabetic polyneuropathy: Secondary | ICD-10-CM | POA: Diagnosis not present

## 2022-03-15 DIAGNOSIS — E1159 Type 2 diabetes mellitus with other circulatory complications: Secondary | ICD-10-CM | POA: Diagnosis not present

## 2022-03-16 DIAGNOSIS — E1142 Type 2 diabetes mellitus with diabetic polyneuropathy: Secondary | ICD-10-CM | POA: Diagnosis not present

## 2022-03-16 DIAGNOSIS — E785 Hyperlipidemia, unspecified: Secondary | ICD-10-CM | POA: Diagnosis not present

## 2022-03-31 DIAGNOSIS — H2513 Age-related nuclear cataract, bilateral: Secondary | ICD-10-CM | POA: Diagnosis not present

## 2022-03-31 DIAGNOSIS — E119 Type 2 diabetes mellitus without complications: Secondary | ICD-10-CM | POA: Diagnosis not present

## 2022-04-15 DIAGNOSIS — E1142 Type 2 diabetes mellitus with diabetic polyneuropathy: Secondary | ICD-10-CM | POA: Diagnosis not present

## 2022-04-16 DIAGNOSIS — E785 Hyperlipidemia, unspecified: Secondary | ICD-10-CM | POA: Diagnosis not present

## 2022-04-16 DIAGNOSIS — E1142 Type 2 diabetes mellitus with diabetic polyneuropathy: Secondary | ICD-10-CM | POA: Diagnosis not present

## 2022-04-26 DIAGNOSIS — E1142 Type 2 diabetes mellitus with diabetic polyneuropathy: Secondary | ICD-10-CM | POA: Diagnosis not present

## 2022-04-26 DIAGNOSIS — E785 Hyperlipidemia, unspecified: Secondary | ICD-10-CM | POA: Diagnosis not present

## 2022-05-04 DIAGNOSIS — E785 Hyperlipidemia, unspecified: Secondary | ICD-10-CM | POA: Diagnosis not present

## 2022-05-04 DIAGNOSIS — E1142 Type 2 diabetes mellitus with diabetic polyneuropathy: Secondary | ICD-10-CM | POA: Diagnosis not present

## 2022-05-04 DIAGNOSIS — N1831 Chronic kidney disease, stage 3a: Secondary | ICD-10-CM | POA: Diagnosis not present

## 2022-05-04 DIAGNOSIS — Z23 Encounter for immunization: Secondary | ICD-10-CM | POA: Diagnosis not present

## 2022-05-15 DIAGNOSIS — E1142 Type 2 diabetes mellitus with diabetic polyneuropathy: Secondary | ICD-10-CM | POA: Diagnosis not present

## 2022-05-16 DIAGNOSIS — E1142 Type 2 diabetes mellitus with diabetic polyneuropathy: Secondary | ICD-10-CM | POA: Diagnosis not present

## 2022-05-16 DIAGNOSIS — E785 Hyperlipidemia, unspecified: Secondary | ICD-10-CM | POA: Diagnosis not present

## 2022-05-27 DIAGNOSIS — Z23 Encounter for immunization: Secondary | ICD-10-CM | POA: Diagnosis not present

## 2022-06-15 DIAGNOSIS — E1142 Type 2 diabetes mellitus with diabetic polyneuropathy: Secondary | ICD-10-CM | POA: Diagnosis not present

## 2022-06-16 DIAGNOSIS — E785 Hyperlipidemia, unspecified: Secondary | ICD-10-CM | POA: Diagnosis not present

## 2022-06-16 DIAGNOSIS — E1142 Type 2 diabetes mellitus with diabetic polyneuropathy: Secondary | ICD-10-CM | POA: Diagnosis not present

## 2022-07-15 DIAGNOSIS — E1142 Type 2 diabetes mellitus with diabetic polyneuropathy: Secondary | ICD-10-CM | POA: Diagnosis not present

## 2022-07-16 DIAGNOSIS — E1142 Type 2 diabetes mellitus with diabetic polyneuropathy: Secondary | ICD-10-CM | POA: Diagnosis not present

## 2022-07-16 DIAGNOSIS — E785 Hyperlipidemia, unspecified: Secondary | ICD-10-CM | POA: Diagnosis not present

## 2022-08-14 DIAGNOSIS — E1142 Type 2 diabetes mellitus with diabetic polyneuropathy: Secondary | ICD-10-CM | POA: Diagnosis not present

## 2022-08-16 DIAGNOSIS — E785 Hyperlipidemia, unspecified: Secondary | ICD-10-CM | POA: Diagnosis not present

## 2022-08-16 DIAGNOSIS — E1142 Type 2 diabetes mellitus with diabetic polyneuropathy: Secondary | ICD-10-CM | POA: Diagnosis not present

## 2022-09-02 DIAGNOSIS — E785 Hyperlipidemia, unspecified: Secondary | ICD-10-CM | POA: Diagnosis not present

## 2022-09-02 DIAGNOSIS — E1142 Type 2 diabetes mellitus with diabetic polyneuropathy: Secondary | ICD-10-CM | POA: Diagnosis not present

## 2022-09-06 DIAGNOSIS — E785 Hyperlipidemia, unspecified: Secondary | ICD-10-CM | POA: Diagnosis not present

## 2022-09-06 DIAGNOSIS — E1159 Type 2 diabetes mellitus with other circulatory complications: Secondary | ICD-10-CM | POA: Diagnosis not present

## 2022-09-06 DIAGNOSIS — Z139 Encounter for screening, unspecified: Secondary | ICD-10-CM | POA: Diagnosis not present

## 2022-09-06 DIAGNOSIS — I152 Hypertension secondary to endocrine disorders: Secondary | ICD-10-CM | POA: Diagnosis not present

## 2022-09-06 DIAGNOSIS — N1831 Chronic kidney disease, stage 3a: Secondary | ICD-10-CM | POA: Diagnosis not present

## 2022-09-14 DIAGNOSIS — E1142 Type 2 diabetes mellitus with diabetic polyneuropathy: Secondary | ICD-10-CM | POA: Diagnosis not present

## 2022-09-16 DIAGNOSIS — E1142 Type 2 diabetes mellitus with diabetic polyneuropathy: Secondary | ICD-10-CM | POA: Diagnosis not present

## 2022-09-16 DIAGNOSIS — E785 Hyperlipidemia, unspecified: Secondary | ICD-10-CM | POA: Diagnosis not present

## 2022-09-20 DIAGNOSIS — I152 Hypertension secondary to endocrine disorders: Secondary | ICD-10-CM | POA: Diagnosis not present

## 2022-09-20 DIAGNOSIS — E1159 Type 2 diabetes mellitus with other circulatory complications: Secondary | ICD-10-CM | POA: Diagnosis not present

## 2022-09-20 DIAGNOSIS — M791 Myalgia, unspecified site: Secondary | ICD-10-CM | POA: Diagnosis not present

## 2022-10-05 DIAGNOSIS — E1142 Type 2 diabetes mellitus with diabetic polyneuropathy: Secondary | ICD-10-CM | POA: Diagnosis not present

## 2022-10-05 DIAGNOSIS — Z9181 History of falling: Secondary | ICD-10-CM | POA: Diagnosis not present

## 2022-10-05 DIAGNOSIS — E1159 Type 2 diabetes mellitus with other circulatory complications: Secondary | ICD-10-CM | POA: Diagnosis not present

## 2022-10-05 DIAGNOSIS — I152 Hypertension secondary to endocrine disorders: Secondary | ICD-10-CM | POA: Diagnosis not present

## 2022-10-05 DIAGNOSIS — M7918 Myalgia, other site: Secondary | ICD-10-CM | POA: Diagnosis not present

## 2022-10-05 DIAGNOSIS — N1831 Chronic kidney disease, stage 3a: Secondary | ICD-10-CM | POA: Diagnosis not present

## 2022-10-14 DIAGNOSIS — E1142 Type 2 diabetes mellitus with diabetic polyneuropathy: Secondary | ICD-10-CM | POA: Diagnosis not present

## 2023-04-17 DIAGNOSIS — M069 Rheumatoid arthritis, unspecified: Secondary | ICD-10-CM | POA: Diagnosis not present

## 2023-04-17 DIAGNOSIS — E1142 Type 2 diabetes mellitus with diabetic polyneuropathy: Secondary | ICD-10-CM | POA: Diagnosis not present

## 2023-05-16 DIAGNOSIS — E1142 Type 2 diabetes mellitus with diabetic polyneuropathy: Secondary | ICD-10-CM | POA: Diagnosis not present

## 2023-05-17 DIAGNOSIS — M069 Rheumatoid arthritis, unspecified: Secondary | ICD-10-CM | POA: Diagnosis not present

## 2023-05-17 DIAGNOSIS — E1142 Type 2 diabetes mellitus with diabetic polyneuropathy: Secondary | ICD-10-CM | POA: Diagnosis not present

## 2023-05-19 DIAGNOSIS — M7989 Other specified soft tissue disorders: Secondary | ICD-10-CM | POA: Diagnosis not present

## 2023-05-19 DIAGNOSIS — Z79899 Other long term (current) drug therapy: Secondary | ICD-10-CM | POA: Diagnosis not present

## 2023-05-19 DIAGNOSIS — M0609 Rheumatoid arthritis without rheumatoid factor, multiple sites: Secondary | ICD-10-CM | POA: Diagnosis not present

## 2023-05-19 DIAGNOSIS — M25559 Pain in unspecified hip: Secondary | ICD-10-CM | POA: Diagnosis not present

## 2023-05-19 DIAGNOSIS — M79643 Pain in unspecified hand: Secondary | ICD-10-CM | POA: Diagnosis not present

## 2023-05-19 DIAGNOSIS — M353 Polymyalgia rheumatica: Secondary | ICD-10-CM | POA: Diagnosis not present

## 2023-05-19 DIAGNOSIS — M1990 Unspecified osteoarthritis, unspecified site: Secondary | ICD-10-CM | POA: Diagnosis not present

## 2023-05-25 DIAGNOSIS — E1142 Type 2 diabetes mellitus with diabetic polyneuropathy: Secondary | ICD-10-CM | POA: Diagnosis not present

## 2023-05-25 DIAGNOSIS — E785 Hyperlipidemia, unspecified: Secondary | ICD-10-CM | POA: Diagnosis not present

## 2023-06-01 DIAGNOSIS — E782 Mixed hyperlipidemia: Secondary | ICD-10-CM | POA: Diagnosis not present

## 2023-06-01 DIAGNOSIS — E1169 Type 2 diabetes mellitus with other specified complication: Secondary | ICD-10-CM | POA: Diagnosis not present

## 2023-06-01 DIAGNOSIS — I152 Hypertension secondary to endocrine disorders: Secondary | ICD-10-CM | POA: Diagnosis not present

## 2023-06-01 DIAGNOSIS — Z23 Encounter for immunization: Secondary | ICD-10-CM | POA: Diagnosis not present

## 2023-06-01 DIAGNOSIS — E1159 Type 2 diabetes mellitus with other circulatory complications: Secondary | ICD-10-CM | POA: Diagnosis not present

## 2023-06-15 DIAGNOSIS — Z23 Encounter for immunization: Secondary | ICD-10-CM | POA: Diagnosis not present

## 2023-06-16 DIAGNOSIS — E1142 Type 2 diabetes mellitus with diabetic polyneuropathy: Secondary | ICD-10-CM | POA: Diagnosis not present

## 2023-06-17 DIAGNOSIS — M069 Rheumatoid arthritis, unspecified: Secondary | ICD-10-CM | POA: Diagnosis not present

## 2023-06-17 DIAGNOSIS — E1142 Type 2 diabetes mellitus with diabetic polyneuropathy: Secondary | ICD-10-CM | POA: Diagnosis not present

## 2023-06-28 DIAGNOSIS — M0609 Rheumatoid arthritis without rheumatoid factor, multiple sites: Secondary | ICD-10-CM | POA: Diagnosis not present

## 2023-06-28 DIAGNOSIS — M1990 Unspecified osteoarthritis, unspecified site: Secondary | ICD-10-CM | POA: Diagnosis not present

## 2023-06-28 DIAGNOSIS — Z7952 Long term (current) use of systemic steroids: Secondary | ICD-10-CM | POA: Diagnosis not present

## 2023-06-28 DIAGNOSIS — M353 Polymyalgia rheumatica: Secondary | ICD-10-CM | POA: Diagnosis not present

## 2023-07-16 DIAGNOSIS — E1142 Type 2 diabetes mellitus with diabetic polyneuropathy: Secondary | ICD-10-CM | POA: Diagnosis not present

## 2023-07-17 DIAGNOSIS — M069 Rheumatoid arthritis, unspecified: Secondary | ICD-10-CM | POA: Diagnosis not present

## 2023-07-17 DIAGNOSIS — E1142 Type 2 diabetes mellitus with diabetic polyneuropathy: Secondary | ICD-10-CM | POA: Diagnosis not present

## 2023-09-17 DIAGNOSIS — E1142 Type 2 diabetes mellitus with diabetic polyneuropathy: Secondary | ICD-10-CM | POA: Diagnosis not present

## 2023-09-17 DIAGNOSIS — M069 Rheumatoid arthritis, unspecified: Secondary | ICD-10-CM | POA: Diagnosis not present

## 2023-09-28 DIAGNOSIS — E1169 Type 2 diabetes mellitus with other specified complication: Secondary | ICD-10-CM | POA: Diagnosis not present

## 2023-10-12 DIAGNOSIS — N1831 Chronic kidney disease, stage 3a: Secondary | ICD-10-CM | POA: Diagnosis not present

## 2023-10-12 DIAGNOSIS — E1142 Type 2 diabetes mellitus with diabetic polyneuropathy: Secondary | ICD-10-CM | POA: Diagnosis not present

## 2023-10-12 DIAGNOSIS — E782 Mixed hyperlipidemia: Secondary | ICD-10-CM | POA: Diagnosis not present

## 2023-10-12 DIAGNOSIS — E1169 Type 2 diabetes mellitus with other specified complication: Secondary | ICD-10-CM | POA: Diagnosis not present

## 2023-10-15 DIAGNOSIS — E1142 Type 2 diabetes mellitus with diabetic polyneuropathy: Secondary | ICD-10-CM | POA: Diagnosis not present

## 2023-10-15 DIAGNOSIS — M069 Rheumatoid arthritis, unspecified: Secondary | ICD-10-CM | POA: Diagnosis not present

## 2023-10-31 DIAGNOSIS — M353 Polymyalgia rheumatica: Secondary | ICD-10-CM | POA: Diagnosis not present

## 2023-10-31 DIAGNOSIS — M25562 Pain in left knee: Secondary | ICD-10-CM | POA: Diagnosis not present

## 2023-10-31 DIAGNOSIS — M0609 Rheumatoid arthritis without rheumatoid factor, multiple sites: Secondary | ICD-10-CM | POA: Diagnosis not present

## 2023-10-31 DIAGNOSIS — Z7952 Long term (current) use of systemic steroids: Secondary | ICD-10-CM | POA: Diagnosis not present

## 2023-10-31 DIAGNOSIS — M25561 Pain in right knee: Secondary | ICD-10-CM | POA: Diagnosis not present

## 2023-10-31 DIAGNOSIS — M1990 Unspecified osteoarthritis, unspecified site: Secondary | ICD-10-CM | POA: Diagnosis not present

## 2023-11-14 DIAGNOSIS — E1159 Type 2 diabetes mellitus with other circulatory complications: Secondary | ICD-10-CM | POA: Diagnosis not present

## 2023-11-15 DIAGNOSIS — N1831 Chronic kidney disease, stage 3a: Secondary | ICD-10-CM | POA: Diagnosis not present

## 2023-11-15 DIAGNOSIS — E1159 Type 2 diabetes mellitus with other circulatory complications: Secondary | ICD-10-CM | POA: Diagnosis not present

## 2023-12-14 DIAGNOSIS — E1159 Type 2 diabetes mellitus with other circulatory complications: Secondary | ICD-10-CM | POA: Diagnosis not present

## 2023-12-15 DIAGNOSIS — N1831 Chronic kidney disease, stage 3a: Secondary | ICD-10-CM | POA: Diagnosis not present

## 2023-12-15 DIAGNOSIS — E1159 Type 2 diabetes mellitus with other circulatory complications: Secondary | ICD-10-CM | POA: Diagnosis not present

## 2024-01-14 DIAGNOSIS — E1159 Type 2 diabetes mellitus with other circulatory complications: Secondary | ICD-10-CM | POA: Diagnosis not present

## 2024-01-15 DIAGNOSIS — N1831 Chronic kidney disease, stage 3a: Secondary | ICD-10-CM | POA: Diagnosis not present

## 2024-01-15 DIAGNOSIS — E1159 Type 2 diabetes mellitus with other circulatory complications: Secondary | ICD-10-CM | POA: Diagnosis not present

## 2024-02-07 DIAGNOSIS — Z136 Encounter for screening for cardiovascular disorders: Secondary | ICD-10-CM | POA: Diagnosis not present

## 2024-02-07 DIAGNOSIS — Z139 Encounter for screening, unspecified: Secondary | ICD-10-CM | POA: Diagnosis not present

## 2024-02-07 DIAGNOSIS — Z0000 Encounter for general adult medical examination without abnormal findings: Secondary | ICD-10-CM | POA: Diagnosis not present

## 2024-02-07 DIAGNOSIS — N1831 Chronic kidney disease, stage 3a: Secondary | ICD-10-CM | POA: Diagnosis not present

## 2024-02-07 DIAGNOSIS — E782 Mixed hyperlipidemia: Secondary | ICD-10-CM | POA: Diagnosis not present

## 2024-02-07 DIAGNOSIS — Z1389 Encounter for screening for other disorder: Secondary | ICD-10-CM | POA: Diagnosis not present

## 2024-02-07 DIAGNOSIS — E1169 Type 2 diabetes mellitus with other specified complication: Secondary | ICD-10-CM | POA: Diagnosis not present

## 2024-02-07 DIAGNOSIS — M069 Rheumatoid arthritis, unspecified: Secondary | ICD-10-CM | POA: Diagnosis not present

## 2024-02-13 DIAGNOSIS — E1159 Type 2 diabetes mellitus with other circulatory complications: Secondary | ICD-10-CM | POA: Diagnosis not present

## 2024-02-14 DIAGNOSIS — E1159 Type 2 diabetes mellitus with other circulatory complications: Secondary | ICD-10-CM | POA: Diagnosis not present

## 2024-02-14 DIAGNOSIS — N1831 Chronic kidney disease, stage 3a: Secondary | ICD-10-CM | POA: Diagnosis not present

## 2024-03-05 DIAGNOSIS — M810 Age-related osteoporosis without current pathological fracture: Secondary | ICD-10-CM | POA: Diagnosis not present

## 2024-03-05 DIAGNOSIS — M25561 Pain in right knee: Secondary | ICD-10-CM | POA: Diagnosis not present

## 2024-03-05 DIAGNOSIS — M1990 Unspecified osteoarthritis, unspecified site: Secondary | ICD-10-CM | POA: Diagnosis not present

## 2024-03-05 DIAGNOSIS — Z7952 Long term (current) use of systemic steroids: Secondary | ICD-10-CM | POA: Diagnosis not present

## 2024-03-05 DIAGNOSIS — Z13820 Encounter for screening for osteoporosis: Secondary | ICD-10-CM | POA: Diagnosis not present

## 2024-03-05 DIAGNOSIS — M0609 Rheumatoid arthritis without rheumatoid factor, multiple sites: Secondary | ICD-10-CM | POA: Diagnosis not present

## 2024-03-15 DIAGNOSIS — E1159 Type 2 diabetes mellitus with other circulatory complications: Secondary | ICD-10-CM | POA: Diagnosis not present

## 2024-03-16 DIAGNOSIS — E1159 Type 2 diabetes mellitus with other circulatory complications: Secondary | ICD-10-CM | POA: Diagnosis not present

## 2024-03-16 DIAGNOSIS — N1831 Chronic kidney disease, stage 3a: Secondary | ICD-10-CM | POA: Diagnosis not present

## 2024-04-05 DIAGNOSIS — H7291 Unspecified perforation of tympanic membrane, right ear: Secondary | ICD-10-CM | POA: Diagnosis not present

## 2024-04-05 DIAGNOSIS — H9191 Unspecified hearing loss, right ear: Secondary | ICD-10-CM | POA: Diagnosis not present

## 2024-04-05 DIAGNOSIS — H6122 Impacted cerumen, left ear: Secondary | ICD-10-CM | POA: Diagnosis not present

## 2024-04-06 DIAGNOSIS — E1142 Type 2 diabetes mellitus with diabetic polyneuropathy: Secondary | ICD-10-CM | POA: Diagnosis not present

## 2024-04-06 DIAGNOSIS — E785 Hyperlipidemia, unspecified: Secondary | ICD-10-CM | POA: Diagnosis not present

## 2024-04-15 DIAGNOSIS — E1159 Type 2 diabetes mellitus with other circulatory complications: Secondary | ICD-10-CM | POA: Diagnosis not present

## 2024-04-16 DIAGNOSIS — N1831 Chronic kidney disease, stage 3a: Secondary | ICD-10-CM | POA: Diagnosis not present

## 2024-04-16 DIAGNOSIS — E1159 Type 2 diabetes mellitus with other circulatory complications: Secondary | ICD-10-CM | POA: Diagnosis not present

## 2024-04-17 DIAGNOSIS — Z23 Encounter for immunization: Secondary | ICD-10-CM | POA: Diagnosis not present

## 2024-04-17 DIAGNOSIS — E782 Mixed hyperlipidemia: Secondary | ICD-10-CM | POA: Diagnosis not present

## 2024-04-17 DIAGNOSIS — E1169 Type 2 diabetes mellitus with other specified complication: Secondary | ICD-10-CM | POA: Diagnosis not present

## 2024-04-17 DIAGNOSIS — E1159 Type 2 diabetes mellitus with other circulatory complications: Secondary | ICD-10-CM | POA: Diagnosis not present

## 2024-04-17 DIAGNOSIS — N1831 Chronic kidney disease, stage 3a: Secondary | ICD-10-CM | POA: Diagnosis not present

## 2024-05-15 DIAGNOSIS — E1159 Type 2 diabetes mellitus with other circulatory complications: Secondary | ICD-10-CM | POA: Diagnosis not present

## 2024-05-16 DIAGNOSIS — N1831 Chronic kidney disease, stage 3a: Secondary | ICD-10-CM | POA: Diagnosis not present

## 2024-05-16 DIAGNOSIS — E1159 Type 2 diabetes mellitus with other circulatory complications: Secondary | ICD-10-CM | POA: Diagnosis not present

## 2024-06-07 DIAGNOSIS — H1131 Conjunctival hemorrhage, right eye: Secondary | ICD-10-CM | POA: Diagnosis not present

## 2024-06-15 DIAGNOSIS — E1159 Type 2 diabetes mellitus with other circulatory complications: Secondary | ICD-10-CM | POA: Diagnosis not present

## 2024-06-16 DIAGNOSIS — N1831 Chronic kidney disease, stage 3a: Secondary | ICD-10-CM | POA: Diagnosis not present

## 2024-06-16 DIAGNOSIS — E1159 Type 2 diabetes mellitus with other circulatory complications: Secondary | ICD-10-CM | POA: Diagnosis not present
